# Patient Record
Sex: Female | Born: 1970 | Race: White | Hispanic: No | Marital: Married | Smoking: Former smoker
Health system: Southern US, Community
[De-identification: ages and names within clinical notes are randomized; demographics above are authoritative.]

## PROBLEM LIST (undated history)

## (undated) DIAGNOSIS — F3281 Premenstrual dysphoric disorder: Secondary | ICD-10-CM

## (undated) DIAGNOSIS — Z87442 Personal history of urinary calculi: Secondary | ICD-10-CM

## (undated) DIAGNOSIS — Z1239 Encounter for other screening for malignant neoplasm of breast: Principal | ICD-10-CM

## (undated) HISTORY — PX: TONSILLECTOMY: SUR1361

## (undated) HISTORY — PX: ADENOIDECTOMY: SUR15

## (undated) HISTORY — DX: Encounter for other screening for malignant neoplasm of breast: Z12.39

## (undated) HISTORY — PX: GASTRIC BYPASS: SHX52

## (undated) HISTORY — DX: Premenstrual dysphoric disorder: F32.81

---

## 2000-09-09 ENCOUNTER — Encounter: Payer: Self-pay | Admitting: Physical Medicine and Rehabilitation

## 2000-09-09 ENCOUNTER — Inpatient Hospital Stay (HOSPITAL_COMMUNITY)
Admission: AD | Admit: 2000-09-09 | Discharge: 2000-09-21 | Payer: Self-pay | Admitting: Physical Medicine and Rehabilitation

## 2000-09-15 ENCOUNTER — Encounter: Payer: Self-pay | Admitting: Physical Medicine and Rehabilitation

## 2000-10-25 ENCOUNTER — Encounter
Admission: RE | Admit: 2000-10-25 | Discharge: 2001-01-23 | Payer: Self-pay | Admitting: Physical Medicine and Rehabilitation

## 2001-02-17 ENCOUNTER — Encounter
Admission: RE | Admit: 2001-02-17 | Discharge: 2001-05-18 | Payer: Self-pay | Admitting: Physical Medicine and Rehabilitation

## 2001-06-21 ENCOUNTER — Encounter
Admission: RE | Admit: 2001-06-21 | Discharge: 2001-09-19 | Payer: Self-pay | Admitting: Physical Medicine and Rehabilitation

## 2005-01-22 ENCOUNTER — Ambulatory Visit: Payer: Self-pay | Admitting: Pediatrics

## 2008-07-05 HISTORY — PX: CHOLECYSTECTOMY: SHX55

## 2010-07-05 HISTORY — PX: ORTHOPEDIC SURGERY: SHX850

## 2010-12-23 ENCOUNTER — Other Ambulatory Visit: Payer: Self-pay | Admitting: Obstetrics and Gynecology

## 2010-12-23 DIAGNOSIS — N6320 Unspecified lump in the left breast, unspecified quadrant: Secondary | ICD-10-CM

## 2010-12-28 ENCOUNTER — Ambulatory Visit
Admission: RE | Admit: 2010-12-28 | Discharge: 2010-12-28 | Disposition: A | Discharge status: Home / Self Care | Payer: PRIVATE HEALTH INSURANCE | Source: Ambulatory Visit | Attending: Obstetrics and Gynecology | Admitting: Obstetrics and Gynecology

## 2010-12-28 DIAGNOSIS — N6320 Unspecified lump in the left breast, unspecified quadrant: Secondary | ICD-10-CM

## 2012-01-25 ENCOUNTER — Telehealth: Payer: Self-pay | Admitting: *Deleted

## 2012-01-25 NOTE — Telephone Encounter (Signed)
error 

## 2012-02-29 ENCOUNTER — Telehealth: Payer: Self-pay | Admitting: *Deleted

## 2012-02-29 NOTE — Telephone Encounter (Signed)
Left message for pt to return my call to schedule a genetic appt. 

## 2012-03-01 ENCOUNTER — Telehealth: Payer: Self-pay | Admitting: *Deleted

## 2012-03-01 NOTE — Telephone Encounter (Signed)
Confirmed 04/20/12 genetic appt w/ pt.  Called Clydie Braun at referring to make her aware.  Took paperwork to Clydie Braun.

## 2012-04-20 ENCOUNTER — Other Ambulatory Visit: Payer: PRIVATE HEALTH INSURANCE | Admitting: Lab

## 2012-04-20 ENCOUNTER — Ambulatory Visit (HOSPITAL_BASED_OUTPATIENT_CLINIC_OR_DEPARTMENT_OTHER): Payer: PRIVATE HEALTH INSURANCE | Admitting: Genetic Counselor

## 2012-04-20 ENCOUNTER — Encounter: Payer: Self-pay | Admitting: Genetic Counselor

## 2012-04-20 DIAGNOSIS — IMO0002 Reserved for concepts with insufficient information to code with codable children: Secondary | ICD-10-CM

## 2012-04-20 DIAGNOSIS — Z803 Family history of malignant neoplasm of breast: Secondary | ICD-10-CM

## 2012-04-20 NOTE — Progress Notes (Signed)
Dr.  Harold Hedge requested a consultation for genetic counseling and risk assessment for Kathleen Pratt, a 41 y.o. female, for discussion of her family history of breast, prostate, testicular and lung cancer. She presents to clinic today with her husband to discuss the possibility of a genetic predisposition to cancer, and to further clarify her risks, as well as her family members' risks for cancer.   HISTORY OF PRESENT ILLNESS: [name] is a 41 y.o. female with no personal history of cancer.    History reviewed. No pertinent past medical history.  History reviewed. No pertinent past surgical history.  History  Substance Use Topics  . Smoking status: Former Smoker -- 1.0 packs/day for 15 years    Types: Cigarettes    Quit date: 04/20/2004  . Smokeless tobacco: Not on file  . Alcohol Use: Yes     socially    REPRODUCTIVE HISTORY AND PERSONAL RISK ASSESSMENT FACTORS: Menarche was at age 72.   Premenopausal Uterus Intact: Yes Ovaries Intact: Yes G6P3A3 , first live birth at age 50  She has not previously undergone treatment for infertility.   OCP use for 14-15 years   She has not used HRT in the past.    FAMILY HISTORY:  We obtained a detailed, 4-generation family history.  Significant diagnoses are listed below: Family History  Problem Relation Age of Onset  . Breast cancer Mother 22  . Prostate cancer Maternal Uncle 66  . Breast cancer Paternal Aunt     diagnosed in her 21s; father's paternal half sister  . Lung cancer Maternal Grandmother     non smoker; diagnosed in her 23s  . Breast cancer Paternal Grandmother     diagnosed in her late 43s-early 76s  . Testicular cancer Cousin 50    maternal cousin  The patient's mother was diagnosed with breast cancer at age 56 and died at 21.  Her mother had one brother who has prostate cancer diagnosed at age 67.  This uncle has one son, who was diagnosed with testicular cancer at age 72.  The patient's grandmother was a  non-smoker but was diagnosed with lung cancer in her 40s.  The patient's paternal grandmother was diagnosed with breast cancer and died in her 25s.  Her father's paternal half sister was diagnosed with breast cancer in her 10s.  There is no other reported history of cancer.  Patient's maternal ancestors are of unknown descent, and paternal ancestors are of unknown descent. There is no reported Ashkenazi Jewish ancestry. There is no known consanguinity.  GENETIC COUNSELING RISK ASSESSMENT, DISCUSSION, AND SUGGESTED FOLLOW UP: We reviewed the natural history and genetic etiology of sporadic, familial and hereditary cancer syndromes.  About 5-10% of breast cancer is hereditary.  Of this, about 85% is the result of a BRCA1 or BRCA2 mutation.  We reviewed the red flags of hereditary cancer syndromes and the dominant inheritance patterns.  If the BRCA testing is negative, we discussed that we could be testing for the wrong gene.  We discussed gene panels, and that several cancer genes that are associated with different cancers can be tested at the same time.  About 4% of young women with breast cancer who are negative for BRCA mutations have a TP53 mutation.  Additionally there are several other genes that can be associated with young breast cancer.  Because of the young onset of cancer in the patients mother, as well as the different cancers in the family, we will consider the BRCAPlus test.  The patient's family history is suggestive of the following possible diagnosis: hereditary cancer syndrome  We discussed that identification of a hereditary cancer syndrome may help her care providers tailor the patients medical management. If a mutation indicating a hereditary cancer syndrome is detected in this case, the [employer] recommendations would include increased cancer surveillance and possible prophylactic surgery. If a mutation is detected, the patient will be referred back to the  referring provider and to any additional appropriate care providers to discuss the relevant options.   If a mutation is not found in the patient, cancer surveillance options would be discussed for the patient according to the appropriate standard National Comprehensive Cancer Network and American Cancer Society guidelines, with consideration of their personal and family history risk factors. In this case, the patient will be referred back to their care providers for discussions of management.   In order to estimate her chance of having a BRCA mutation, we used statistical models (Penn II and tyrer cusik) and laboratory data that take into account her personal medical history, family history and ancestry.  Because each model is different, there can be a lot of variability in the risks they give.  Therefore, these numbers must be considered a rough range and not a precise risk of having a BRCA mutation.  These models estimate that she has approximately a 5-10% chance of having a mutation. Based on this assessment of her family and personal history, genetic testing is recommended.  Based on the patient's personal and family history, statistical models (Tyrer cusik)  and literature data were used to estimate her risk of developing Breast cancer. These estimate her lifetime risk of developing breast cancer to be approximately 49%-53%. This estimation does not take into account any genetic testing results.   After considering the risks, benefits, and limitations, the patient provided informed consent for  the following  testing: BRCAPlus through [employer]   Per the patient's request, we will contact her by telephone to discuss these results. A follow up genetic counseling visit will be scheduled if indicated.  The patient was seen for a total of 60 minutes, greater than 50% of which was spent face-to-face counseling.  This plan is being carried out per Dr. Huel Coventry recommendations.  This note will also  be sent to the referring provider via the electronic medical record. The patient will be supplied with a summary of this genetic counseling discussion as well as educational information on the discussed hereditary cancer syndromes following the conclusion of their visit.   Patient was discussed with Dr. Drue Second.   _______________________________________________________________________ For Office Staff:  Number of people involved in session: 3 Was an Intern/ student involved with case: no

## 2012-05-04 ENCOUNTER — Telehealth: Payer: Self-pay | Admitting: *Deleted

## 2012-05-04 ENCOUNTER — Encounter: Payer: Self-pay | Admitting: Oncology

## 2012-05-04 ENCOUNTER — Ambulatory Visit (HOSPITAL_BASED_OUTPATIENT_CLINIC_OR_DEPARTMENT_OTHER): Payer: PRIVATE HEALTH INSURANCE | Admitting: Oncology

## 2012-05-04 VITALS — BP 131/79 | HR 98 | Temp 98.5°F | Resp 20 | Ht 64.5 in | Wt 177.6 lb

## 2012-05-04 DIAGNOSIS — Z1239 Encounter for other screening for malignant neoplasm of breast: Secondary | ICD-10-CM

## 2012-05-04 DIAGNOSIS — Z1231 Encounter for screening mammogram for malignant neoplasm of breast: Secondary | ICD-10-CM

## 2012-05-04 DIAGNOSIS — Z803 Family history of malignant neoplasm of breast: Secondary | ICD-10-CM

## 2012-05-04 DIAGNOSIS — Z809 Family history of malignant neoplasm, unspecified: Secondary | ICD-10-CM

## 2012-05-04 HISTORY — DX: Encounter for other screening for malignant neoplasm of breast: Z12.39

## 2012-05-04 NOTE — Telephone Encounter (Signed)
Gave patient appointment for 07-27-2012 starting at 8:30am  Gave patient appointment for the mri of the breast arrival time 12:45pm

## 2012-05-04 NOTE — Patient Instructions (Addendum)
We discussed your breast cancer  We will order a MRI for surveillance  I will see you back in 2 months for follow up

## 2012-05-04 NOTE — Progress Notes (Signed)
[facility]  High Risk Clinic New Patient Evaluation  Name: Kathleen Pratt            Date: 05/04/2012 MRN: [id number]                DOB: July 12, 1970  CC: Dr. Feliciana Rossetti Dr. Henderson Cloud  REFERRING PHYSICIAN: Dr. Henderson Cloud   REASON FOR VISIT: 41 year old female with family history of cancers who was seen in the genetic clinic for discussion of risk of hereditary cancers.  HISTORY OF PRESENT ILLNESS: Kathleen Pratt is a 41 y.o. female here for hereditary cancer risk assessment. She presents to the clinic with concerns regarding a possibility of hereditary predisposition to cancer and the implication of this for her own healthcare and for her family. Patient has not had prior history of biopsies. She was recently seen by Maylon Cos in the genetics clinic for risk assessment and genetic testing. She did have blood drawn for BRCA plus through [facility]. Results of which are pending at this time. During the counseling session patient did have a tyrer cusick risk assessment performed and her lifetime risk of developing breast cancer was anywhere between 49-53%. Because of this she is referred to the high-risk clinic for discussion of reducing her risk of developing cancers specifically breast cancer. She otherwise is without any complaints.  PAST MEDICAL HISTORY:  has a past medical history of Breast cancer screening, high risk patient (05/04/12).  PAST SURGICAL HISTORY: No past surgical history on file.    CURRENT MEDICATIONS: Ms. Buza does not currently have medications on file.  ALLERGIES: Erythromycin  SOCIAL HISTORY:  reports that she quit smoking about 8 years ago. Her smoking use included Cigarettes. She has a 15 pack-year smoking history. She does not have any smokeless tobacco history on file. She reports that she drinks alcohol.  HEALTH HABITS: Vitamins: yes Supplements: no Alternative Therapies: no Adverse environmental exposure: no Servings of fruit  and vegetables/day: 3-4/day Servings of meat/day:2 servings/day Exercises regularly:        30 min/day    7 days Smoker/nonsmoker: former smoker quit at 34 Alcohol: 1/month Number of alcoholic beverages/week:0  REPRODUCTIVE HISTORY:  Menarche age: 68 Gravida:   6    Para: 3 First Live Birth: 28 Number of live births: 3 Breast fed: Y/N  # months yes all children 10/17/11 Took fertility meds:     none                Type:  Menses: regular menses Oral Contraceptives:      15   # of years Menopause: natural/surgical  Age N/A HRT Y/N Currently Y/N TType N/A:                                   # years Sexually transmitted disease:  N/A  FAMILY HISTORY:  family history includes Breast cancer in her paternal aunt and paternal grandmother; Breast cancer (age of onset:28) in her mother; Lung cancer in her maternal grandmother; Prostate cancer (age of onset:66) in her maternal uncle; and Testicular cancer (age of onset:25) in her cousin.  HEALTH MAINTENANCE: Last mammogram: 2 months Last clinical breast exam: 2 months Performs self breast exam: yes Last Pap Smear: 2 months Colonoscopy: not yet Last skin exam: no  REVIEW OF SYSTEMS:  General: Negative for fever, chills, night sweats,  loss of appetite or weight loss. HEENT: Negative for headaches, sore  throat, difficulty swallowing, blurred vision or problem with hearing or  sinus congestion. Respiratory: Negative for shortness of breath, cough  or dyspnea on exertion. Cardiovascular: Negative for chest pain,  palpitations or pedal edema. GI: Negative for nausea, vomiting,  diarrhea, constipation, change in bowel habits or blood in the stool.  No jaundice. GU: Negative for painful or frequent urination, change in  color of urine, or decreased urinary stream. Integumentary: Negative  for skin rashes or other suspicious skin lesions. Hematologic: Negative  for easy bruisability or bleeding. Musculoskeletal: Negative for  complaints of  pain, arthralgias, arthritis or myalgias.  Neurological/psychiatric: Negative for numbness, focal weakness,  balance problems or coordination difficulties. No depression or mood swings.  Breast: No self detected abnormalities in the breast. No nipple discharge, masses or redness of the skin.   PHYSICAL EXAM: BP 131/79  Pulse 98  Temp 98.5 F (36.9 C) (Oral)  Resp 20  Ht 5' 4.5" (1.638 m)  Wt 177 lb 9.6 oz (80.559 kg)  BMI 30.01 kg/m2 GENERAL: Well developed, well nourished, in no acute distress.  EENT: No ocular or oral lesions. No stomatitis.  RESPIRATORY: Lungs are clear to auscultation bilaterally with normal respiratory movement and no accessory muscle use. CARDIAC: No murmur, rub or tachycardia. No upper or lower extremity edema.  GI: Abdomen is soft, no palpable hepatosplenomegaly. No fluid wave. No tenderness. Musculoskeletal: No kyphosis, no tenderness over the spine, ribs or hips. Lymph: No cervical, infraclavicular, axillary or inguinal adenopathy. Neuro: No focal neurological deficits. Psych: Alert and oriented X 3, appropriate mood and affect.  BREAST EXAM: In the supine position, with the right arm over the head, right nipple is everted. No periareolar edema or nipple discharge. No mass in any quadrant or subareolar region. No redness of the skin. No right axillary adenopathy. With the left arm over the head, left nipple is everted. No periareolar edema or nipple discharge. No mass in any quadrant or subareolar region. No redness of the skin. No left axillary adenopathy.    ASSESSMENT: 41 year old female with family history significant for rest cancers on the maternal side with mother having had breast cancer at 70 paternal uncle with prostate cancer at 24 patient had a paternal aunt who was diagnosed with cancer in her 44s. There is also history of breast cancer in a paternal grandmother in her late 15s. A cousin had testicular cancer. Because of this patient's risk of  developing cancer is high but it is uncertain whether she has a genetic predisposition. She was seen by our genetic counselor and she indeed did have a BRCA plus testing performed. Unfortunately the results of this is not available to me. Today patient and I in general discussed hereditary breast cancers including discussion of BRCA1 and BRCA2 gene mutations. We discussed risk reductions surgically versus medically. We also discussed surveillance which essentially is early detection. I do think that if patient is a BRCA2 carrier then she may be a good candidate for either surgical risk reduction for breast as well as ovarian cancer. Especially if she has completed her family and she is ready to embark on a surgical procedure. Her risk reduction for breast cancer surgically would be greater than 90% and for ovarian cancer greater than 95%. However if patient does not want surgical risk reduction then certainly she would be a candidate for surveillance as well as medical risk reduction. 4 breast cancer medical risk reduction would include use of tamoxifen. For ovarian the possibility of oral contraceptive pills.  Early surveillance would include breast mammograms as well as MRIs self breast examinations and clinical breast exams. For ovarian surveillance we certainly could utilize C1 25 testing as well as vaginal ultrasounds. I did speak to the patient regarding surveillance for ovarian cancer that there really is no proven surveillance but these are 2 modalities that we do use. If patient however is negative for BRCA1 or BRCA2 mutation then she certainly still would be a candidate for chemoprevention with tamoxifen. We also discussed surveillance including mammograms and MRIs on a yearly basis along with clinical and self breast examinations. We also discussed lifestyle modifications including exercise having a good diet plan stress reduction ALT to help with reducing risk. Also reducing her alkaline alcohol  consumption. And not smoking.  PLAN:  #1 I will await the results of patient's genetic testing.  #2 certainly I can get her set her up for MRI of the breasts.  #3 I will plan on seeing her back in a few months time for followup.  The length of time of the face-to-face encounter was 60 minutes. More than 50% of time was spent counseling and coordination of care.  Drue Second, MD Medical/Oncology [facility] [phone] (beeper) [phone] (Office)

## 2012-05-10 ENCOUNTER — Encounter: Payer: Self-pay | Admitting: Genetic Counselor

## 2012-05-10 ENCOUNTER — Telehealth: Payer: Self-pay | Admitting: Genetic Counselor

## 2012-05-10 NOTE — Telephone Encounter (Signed)
Left good news message and asked for her to call back. 

## 2012-05-11 ENCOUNTER — Inpatient Hospital Stay (HOSPITAL_COMMUNITY): Admission: RE | Admit: 2012-05-11 | Payer: PRIVATE HEALTH INSURANCE | Source: Ambulatory Visit

## 2012-05-13 ENCOUNTER — Ambulatory Visit (HOSPITAL_COMMUNITY): Admission: RE | Admit: 2012-05-13 | Payer: PRIVATE HEALTH INSURANCE | Source: Ambulatory Visit

## 2012-05-17 ENCOUNTER — Ambulatory Visit (HOSPITAL_COMMUNITY): Admission: RE | Admit: 2012-05-17 | Payer: PRIVATE HEALTH INSURANCE | Source: Ambulatory Visit

## 2012-05-17 ENCOUNTER — Telehealth: Payer: Self-pay | Admitting: *Deleted

## 2012-05-17 NOTE — Telephone Encounter (Signed)
Call back from pt request Order be faxed to [phone]. Pt will take to scheduling for an appt to be made.

## 2012-05-17 NOTE — Telephone Encounter (Signed)
Pt called request MRI of Breast be done at [facility] as she works there and it's closer and insurance. Called MRI at [facility] cancelled pt's MRI appt for 05/17/12. Returned pt's called lmovm for pt to call back with a fax # for MRI order to be sent. MD aware.

## 2012-06-06 ENCOUNTER — Telehealth: Payer: Self-pay | Admitting: *Deleted

## 2012-06-06 ENCOUNTER — Other Ambulatory Visit: Payer: Self-pay | Admitting: Oncology

## 2012-06-06 DIAGNOSIS — R928 Other abnormal and inconclusive findings on diagnostic imaging of breast: Secondary | ICD-10-CM

## 2012-06-06 NOTE — Telephone Encounter (Signed)
Pt called lmovm with concern regarding a recommendation for a biopsy. Returned pt's call per MD, she agrees pt should have an Biopsy to rule out anything further.

## 2012-06-12 ENCOUNTER — Telehealth: Payer: Self-pay | Admitting: Oncology

## 2012-06-12 NOTE — Telephone Encounter (Signed)
C/D 06/12/12 for appt.07/27/12

## 2012-06-14 ENCOUNTER — Ambulatory Visit: Admission: RE | Admit: 2012-06-14 | Payer: PRIVATE HEALTH INSURANCE | Source: Ambulatory Visit

## 2012-06-14 ENCOUNTER — Ambulatory Visit
Admission: RE | Admit: 2012-06-14 | Discharge: 2012-06-14 | Disposition: A | Discharge status: Home / Self Care | Payer: PRIVATE HEALTH INSURANCE | Source: Ambulatory Visit | Attending: Oncology | Admitting: Oncology

## 2012-06-14 DIAGNOSIS — R928 Other abnormal and inconclusive findings on diagnostic imaging of breast: Secondary | ICD-10-CM

## 2012-06-14 MED ORDER — GADOBENATE DIMEGLUMINE 529 MG/ML IV SOLN
15.0000 mL | Freq: Once | INTRAVENOUS | Status: AC | PRN
  Administered 2012-06-14: 15 mL via INTRAVENOUS

## 2012-06-15 ENCOUNTER — Telehealth: Payer: Self-pay | Admitting: Medical Oncology

## 2012-06-15 NOTE — Telephone Encounter (Signed)
Please let patient know that we can discuss this at her next upcoming visit.

## 2012-06-15 NOTE — Telephone Encounter (Signed)
Patient called with concern regarding results of recent MRI at [facility] (12/11), states "they didn't find the same results that [facility] did on 11/26, I'm worried because on one MRI you can see and measure it but you can't see it on another. Is it because I was at different days in my monthly cycle? I have a lot of fibroids in my breasts. I don't wish to sit for 6 months waiting, I want a resolution. I'm concerned that the next MRI is too far ahead when [place] says I have something." Patient would like to speak with MD regarding her concerns. Will review with MD.

## 2012-06-16 ENCOUNTER — Telehealth: Payer: Self-pay | Admitting: Medical Oncology

## 2012-06-16 NOTE — Telephone Encounter (Signed)
Error

## 2012-06-16 NOTE — Telephone Encounter (Signed)
Patient called wishing to discuss results of her MRI, no results received as of yet from MRI done (12/11). I had also asked pt to have [facility] send results to our office for Dr. Welton Flakes to review. Patient stated she will call them and ask them to send the MRI 11/26 results to Dr. Welton Flakes. Patient would also like to see Dr. Welton Flakes before her scheduled appt 07/27/12. Returned call to pt and LVMOM to notify pt that Dr. Welton Flakes has no available earlier appts at this time and should she have any further questions to please call us.

## 2012-06-21 ENCOUNTER — Encounter: Payer: Self-pay | Admitting: Oncology

## 2012-07-26 ENCOUNTER — Other Ambulatory Visit: Payer: Self-pay | Admitting: *Deleted

## 2012-07-26 DIAGNOSIS — Z1231 Encounter for screening mammogram for malignant neoplasm of breast: Secondary | ICD-10-CM

## 2012-07-27 ENCOUNTER — Telehealth: Payer: Self-pay | Admitting: Oncology

## 2012-07-27 ENCOUNTER — Ambulatory Visit (HOSPITAL_BASED_OUTPATIENT_CLINIC_OR_DEPARTMENT_OTHER): Payer: PRIVATE HEALTH INSURANCE | Admitting: Oncology

## 2012-07-27 ENCOUNTER — Encounter: Payer: Self-pay | Admitting: Oncology

## 2012-07-27 ENCOUNTER — Other Ambulatory Visit: Payer: Self-pay | Admitting: Medical Oncology

## 2012-07-27 ENCOUNTER — Other Ambulatory Visit (HOSPITAL_BASED_OUTPATIENT_CLINIC_OR_DEPARTMENT_OTHER): Payer: PRIVATE HEALTH INSURANCE | Admitting: Lab

## 2012-07-27 VITALS — BP 125/82 | HR 80 | Temp 98.4°F | Resp 20 | Ht 64.5 in | Wt 166.5 lb

## 2012-07-27 DIAGNOSIS — Z1231 Encounter for screening mammogram for malignant neoplasm of breast: Secondary | ICD-10-CM

## 2012-07-27 DIAGNOSIS — Z1239 Encounter for other screening for malignant neoplasm of breast: Secondary | ICD-10-CM

## 2012-07-27 LAB — COMPREHENSIVE METABOLIC PANEL (CC13)
AST: 25 U/L (ref 5–34)
Albumin: 3.4 g/dL — ABNORMAL LOW (ref 3.5–5.0)
Alkaline Phosphatase: 94 U/L (ref 40–150)
Chloride: 107 mEq/L (ref 98–107)
Glucose: 76 mg/dl (ref 70–99)
Potassium: 3.7 mEq/L (ref 3.5–5.1)
Sodium: 140 mEq/L (ref 136–145)
Total Protein: 6.3 g/dL — ABNORMAL LOW (ref 6.4–8.3)

## 2012-07-27 LAB — CBC WITH DIFFERENTIAL/PLATELET
HCT: 39.1 % (ref 34.8–46.6)
MCH: 32.7 pg (ref 25.1–34.0)
MCHC: 34.8 g/dL (ref 31.5–36.0)
NEUT%: 51.4 % (ref 38.4–76.8)
RDW: 13 % (ref 11.2–14.5)
lymph#: 1.5 10*3/uL (ref 0.9–3.3)

## 2012-07-27 NOTE — Patient Instructions (Addendum)
Proceed with MRI of bilateral breasts  I will see you back after the MRI

## 2012-07-27 NOTE — Telephone Encounter (Signed)
gv pt appt schedule for May. Pt aware I will contact her re appt for mri-breast. Per order breast mri in May - no mammo order given or on schedule and pt is not due. Pt will need mammo b4 proceeding w/mri. lmonvm for [place] Ponder @ the [facility] to confirm this so I can get mammo order if needed.

## 2012-07-28 ENCOUNTER — Telehealth: Payer: Self-pay | Admitting: Oncology

## 2012-07-28 NOTE — Telephone Encounter (Signed)
S/w [place] Ponder @ [facility] yesterday (1/23) and per Lynden Ang pt does not need a mammo b4 having breast mri in May due to how she is being monitored. Per Lynden Ang she will call pt w/appt for breast mri in May. S/w pt today she is aware and was given May schedule prior to leaving yesterday.

## 2012-08-02 NOTE — Progress Notes (Signed)
[facility]  High Risk Clinic Follow-up Visit  Name: Kathleen Pratt            Date: 08/02/2012 MRN: [id number]                DOB: 06/29/1971  CC:  Dr. Henderson Cloud Dr. Feliciana Rossetti  REFERRING PHYSICIAN: Dr. Feliciana Rossetti  REASON FOR VISIT: Patient is here for followup after having had her MRI of the bilateral breast  HISTORY OF PRESENT ILLNESS: Neka Bise is a 42 y.o. female here for follow-up of hereditary cancer risk assessment. Patient recently had a MRI performed at [facility] that showed an area of clumped enhancement in the left breast at the 6:00 position. Because of this she was referred to the breast Center for MR guided biopsy of this abnormal enhancement on 06/14/2012. A MRI was performed again and patient had no evidence of an abnormal enhancement in that position. Therefore a six-month followup MRI was recommended. Clinically patient is without any complaints.  CURRENT MEDICATIONS: Ms. Hainer does not currently have medications on file.  ALLERGIES: Erythromycin  SOCIAL HISTORY:  reports that she quit smoking about 8 years ago. Her smoking use included Cigarettes. She has a 15 pack-year smoking history. She does not have any smokeless tobacco history on file. She reports that she drinks alcohol.  CHANGE IN FAMILY HISTORY:    HEALTH MAINTENANCE: Last mammogram: Last clinical breast exam: Performs self breast exam: Last Pap Smear: Colonoscopy:  Last skin exam:  REVIEW OF SYSTEMS:  General: Negative for fever, chills, night sweats,  loss of appetite or weight loss. HEENT: Negative for headaches, sore  throat, difficulty swallowing, blurred vision or problem with hearing or  sinus congestion. Respiratory: Negative for shortness of breath, cough  or dyspnea on exertion. Cardiovascular: Negative for chest pain,  palpitations or pedal edema. GI: Negative for nausea, vomiting,  diarrhea, constipation, change in bowel habits or blood in the  stool.  No jaundice. GU: Negative for painful or frequent urination, change in  color of urine, or decreased urinary stream. Integumentary: Negative  for skin rashes or other suspicious skin lesions. Hematologic: Negative  for easy bruisability or bleeding. Musculoskeletal: Negative for  complaints of pain, arthralgias, arthritis or myalgias.  Neurological/psychiatric: Negative for numbness, focal weakness,  balance problems or coordination difficulties. No depression or mood swings.  Breast: No self detected abnormalities in the breast. No nipple discharge, masses or redness of the skin.   PHYSICAL EXAM: BP 125/82  Pulse 80  Temp 98.4 F (36.9 C) (Oral)  Resp 20  Ht 5' 4.5" (1.638 m)  Wt 166 lb 8 oz (75.524 kg)  BMI 28.14 kg/m2 GENERAL: Well developed, well nourished, in no acute distress.  EENT: No ocular or oral lesions. No stomatitis.  RESPIRATORY: Lungs are clear to auscultation bilaterally with normal respiratory movement and no accessory muscle use. CARDIAC: No murmur, rub or tachycardia. No upper or lower extremity edema.  GI: Abdomen is soft, no palpable hepatosplenomegaly. No fluid wave. No tenderness. Musculoskeletal: No kyphosis, no tenderness over the spine, ribs or hips. Lymph: No cervical, infraclavicular, axillary or inguinal adenopathy. Neuro: No focal neurological deficits. Psych: Alert and oriented X 3, appropriate mood and affect.  BREAST EXAM: In the supine position, with the right arm over the head, right nipple is everted. No periareolar edema or nipple discharge. No mass in any quadrant or subareolar region. No redness of the skin. No right axillary adenopathy. With the left arm over the  head, left nipple is everted. No periareolar edema or nipple discharge. No mass in any quadrant or subareolar region. No redness of the skin. No left axillary adenopathy.    ASSESSMENT: 42 year old female with a strong family history for breast cancer. Patient has a lifetime  risk 49-53% this done risk assessment tools. Because of this she has been getting MRI of the breasts performed for some performed was in November 2013. That did show a area of clumped enhancement in the left breast at the 6:00 position. This MRI was performed in [facility]. However subsequent MR guided biopsy was attempted but once be breast was visualized by MR there is no area of enhancement and therefore a biopsy was not done. Patient and I discussed this. Recommendation is at this time 2 followup MRI in 6 months time.  PLAN:  #1 patient will followup with an MRI of the breasts in 6 months time.  #2 patient had considerable amount of anxiety I spent quite a bit of time with her discussing the different reasons that the MRI may have may not have shown the area. I tried to reassure her. Patient was very tearful. But after extensive discussion she was better composed. And she will return in 6 months time for followup with me.  The length of time of the face-to-face encounter was 40    minutes. More than 50% of time was spent counseling and coordination of care.  Drue Second, MD Medical/Oncology [facility] [phone] (beeper) [phone] (Office)  08/02/2012, 4:03 PM

## 2012-11-08 ENCOUNTER — Telehealth: Payer: Self-pay | Admitting: *Deleted

## 2012-11-08 NOTE — Telephone Encounter (Signed)
Pt called to cancel her appt and r/s because her MRI is not until June. gv pt appt d/t for 02/16/13. Also made her aware that i will mail her a cal...td

## 2012-11-13 ENCOUNTER — Other Ambulatory Visit: Payer: PRIVATE HEALTH INSURANCE | Admitting: Lab

## 2012-11-13 ENCOUNTER — Ambulatory Visit: Payer: PRIVATE HEALTH INSURANCE | Admitting: Oncology

## 2012-12-09 ENCOUNTER — Ambulatory Visit
Admission: RE | Admit: 2012-12-09 | Discharge: 2012-12-09 | Disposition: A | Discharge status: Home / Self Care | Payer: PRIVATE HEALTH INSURANCE | Source: Ambulatory Visit | Attending: Oncology | Admitting: Oncology

## 2012-12-09 DIAGNOSIS — Z1239 Encounter for other screening for malignant neoplasm of breast: Secondary | ICD-10-CM

## 2012-12-09 MED ORDER — GADOBENATE DIMEGLUMINE 529 MG/ML IV SOLN
14.0000 mL | Freq: Once | INTRAVENOUS | Status: AC | PRN
  Administered 2012-12-09: 14 mL via INTRAVENOUS

## 2012-12-12 ENCOUNTER — Other Ambulatory Visit: Payer: Self-pay | Admitting: Emergency Medicine

## 2012-12-13 NOTE — Progress Notes (Signed)
Quick Note:  Please call patient: MRI breasts look good ______

## 2013-02-15 ENCOUNTER — Other Ambulatory Visit: Payer: Self-pay | Admitting: *Deleted

## 2013-02-15 DIAGNOSIS — Z1239 Encounter for other screening for malignant neoplasm of breast: Secondary | ICD-10-CM

## 2013-02-16 ENCOUNTER — Encounter: Payer: Self-pay | Admitting: Oncology

## 2013-02-16 ENCOUNTER — Other Ambulatory Visit (HOSPITAL_BASED_OUTPATIENT_CLINIC_OR_DEPARTMENT_OTHER): Payer: PRIVATE HEALTH INSURANCE | Admitting: Lab

## 2013-02-16 ENCOUNTER — Ambulatory Visit (HOSPITAL_BASED_OUTPATIENT_CLINIC_OR_DEPARTMENT_OTHER): Payer: PRIVATE HEALTH INSURANCE | Admitting: Oncology

## 2013-02-16 ENCOUNTER — Telehealth: Payer: Self-pay | Admitting: *Deleted

## 2013-02-16 VITALS — BP 110/70 | HR 91 | Temp 98.5°F | Resp 20 | Ht 64.5 in | Wt 161.2 lb

## 2013-02-16 DIAGNOSIS — Z803 Family history of malignant neoplasm of breast: Secondary | ICD-10-CM

## 2013-02-16 DIAGNOSIS — Z1231 Encounter for screening mammogram for malignant neoplasm of breast: Secondary | ICD-10-CM

## 2013-02-16 DIAGNOSIS — Z1239 Encounter for other screening for malignant neoplasm of breast: Secondary | ICD-10-CM

## 2013-02-16 LAB — CBC WITH DIFFERENTIAL/PLATELET
EOS%: 0 % (ref 0.0–7.0)
Eosinophils Absolute: 0 10*3/uL (ref 0.0–0.5)
HCT: 39.5 % (ref 34.8–46.6)
HGB: 12.8 g/dL (ref 11.6–15.9)
MCH: 30.5 pg (ref 25.1–34.0)
MCHC: 32.4 g/dL (ref 31.5–36.0)
MCV: 94 fL (ref 79.5–101.0)
NEUT#: 1.9 10*3/uL (ref 1.5–6.5)
Platelets: 170 10*3/uL (ref 145–400)
RDW: 12.6 % (ref 11.2–14.5)

## 2013-02-16 LAB — COMPREHENSIVE METABOLIC PANEL (CC13)
AST: 18 U/L (ref 5–34)
Calcium: 8.5 mg/dL (ref 8.4–10.4)
Chloride: 112 mEq/L — ABNORMAL HIGH (ref 98–109)
Glucose: 58 mg/dl — ABNORMAL LOW (ref 70–140)
Potassium: 3.9 mEq/L (ref 3.5–5.1)
Sodium: 143 mEq/L (ref 136–145)

## 2013-02-16 MED ORDER — TAMOXIFEN CITRATE 20 MG PO TABS: 20.0000 mg | ORAL_TABLET | Freq: Every day | ORAL | Status: DC

## 2013-02-16 NOTE — Telephone Encounter (Signed)
appts made and printed...td 

## 2013-02-16 NOTE — Patient Instructions (Addendum)
We discussed your MRI results  We discussed starting you on tamoxifen 20 mg daily chemoprevention  I will see you back in 4 months   Tamoxifen oral tablet What is this medicine? TAMOXIFEN (ta MOX i fen) blocks the effects of estrogen. It is commonly used to treat breast cancer. It is also used to decrease the chance of breast cancer coming back in women who have received treatment for the disease. It may also help prevent breast cancer in women who have a high risk of developing breast cancer. This medicine may be used for other purposes; ask your health care provider or pharmacist if you have questions. What should I tell my health care provider before I take this medicine? They need to know if you have any of these conditions: -blood clots -blood disease -cataracts or impaired eyesight -endometriosis -high calcium levels -high cholesterol -irregular menstrual cycles -liver disease -stroke -uterine fibroids -an unusual or allergic reaction to tamoxifen, other medicines, foods, dyes, or preservatives -pregnant or trying to get pregnant -breast-feeding How should I use this medicine? Take this medicine by mouth with a glass of water. Follow the directions on the prescription label. You can take it with or without food. Take your medicine at regular intervals. Do not take your medicine more often than directed. Do not stop taking except on your doctor's advice. A special MedGuide will be given to you by the pharmacist with each prescription and refill. Be sure to read this information carefully each time. Talk to your pediatrician regarding the use of this medicine in children. While this drug may be prescribed for selected conditions, precautions do apply. Overdosage: If you think you have taken too much of this medicine contact a poison control center or emergency room at once. NOTE: This medicine is only for you. Do not share this medicine with others. What if I miss a dose? If you  miss a dose, take it as soon as you can. If it is almost time for your next dose, take only that dose. Do not take double or extra doses. What may interact with this medicine? -aminoglutethimide -bromocriptine -chemotherapy drugs -female hormones, like estrogens and birth control pills -letrozole -medroxyprogesterone -phenobarbital -rifampin -warfarin This list may not describe all possible interactions. Give your health care provider a list of all the medicines, herbs, non-prescription drugs, or dietary supplements you use. Also tell them if you smoke, drink alcohol, or use illegal drugs. Some items may interact with your medicine. What should I watch for while using this medicine? Visit your doctor or health care professional for regular checks on your progress. You will need regular pelvic exams, breast exams, and mammograms. If you are taking this medicine to reduce your risk of getting breast cancer, you should know that this medicine does not prevent all types of breast cancer. If breast cancer or other problems occur, there is no guarantee that it will be found at an early stage. Do not become pregnant while taking this medicine or for 2 months after stopping this medicine. Stop taking this medicine if you get pregnant or think you are pregnant and contact your doctor. This medicine may harm your unborn baby. Women who can possibly become pregnant should use birth control methods that do not use hormones during tamoxifen treatment and for 2 months after therapy has stopped. Talk with your health care provider for birth control advice. Do not breast feed while taking this medicine. What side effects may I notice from receiving this medicine?  Side effects that you should report to your doctor or health care professional as soon as possible: -changes in vision (blurred vision) -changes in your menstrual cycle -difficulty breathing or shortness of breath -difficulty walking or talking -new  breast lumps -numbness -pelvic pain or pressure -redness, blistering, peeling or loosening of the skin, including inside the mouth -skin rash or itching (hives) -sudden chest pain -swelling of lips, face, or tongue -swelling, pain or tenderness in your calf or leg -unusual bruising or bleeding -vaginal discharge that is bloody, brown, or rust -weakness -yellowing of the whites of the eyes or skin Side effects that usually do not require medical attention (report to your doctor or health care professional if they continue or are bothersome): -fatigue -hair loss, although uncommon and is usually mild -headache -hot flashes -impotence (in men) -nausea, vomiting (mild) -vaginal discharge (white or clear) This list may not describe all possible side effects. Call your doctor for medical advice about side effects. You may report side effects to FDA at 1-800-FDA-1088. Where should I keep my medicine? Keep out of the reach of children. Store at room temperature between 20 and 25 degrees C (68 and 77 degrees F). Protect from light. Keep container tightly closed. Throw away any unused medicine after the expiration date. NOTE: This sheet is a summary. It may not cover all possible information. If you have questions about this medicine, talk to your doctor, pharmacist, or health care provider.  2012, Elsevier/Gold Standard. (03/07/2008 12:01:56 PM)

## 2013-03-05 NOTE — Progress Notes (Signed)
OFFICE PROGRESS NOTE  CC  GRISSO,GREG, MD [address] [place] [address]  DIAGNOSIS: 42 year old female with strong family history of breast cancer here for high-risk clinic.  PRIOR THERAPY: #42 year old female with a strong family history for breast cancer. Patient has a lifetime risk 49-53% this done risk assessment tools. Because of this she has been getting MRI of the breasts performed for some performed was in November 2013. That did show a area of clumped enhancement in the left breast at the 6:00 position. This MRI was performed in [facility]. However subsequent MR guided biopsy was attempted but once be breast was visualized by MR there is no area of enhancement and therefore a biopsy was not done. Patient and I discussed this. Recommendation is at this time 2 followup MRI in 6 months time.  #2 patient had a repeat MRI performed prior to this visit it looks fine.  CURRENT THERAPY:Tamoxifen 20 mg daily  INTERVAL HISTORY: Kathleen Pratt 42 y.o. female returns for followup visit after her MRI. Overall she's feeling better her MRI results were negative. Clinically she has no complaints she is tolerating tamoxifen very nicely without any problems  MEDICAL HISTORY: Past Medical History  Diagnosis Date  . Breast cancer screening, high risk patient 05/04/12    ALLERGIES:  is allergic to erythromycin.  MEDICATIONS:  Current Outpatient Prescriptions  Medication Sig Dispense Refill  . ALPRAZolam (XANAX) 0.5 MG tablet Take 0.5 mg by mouth at bedtime as needed. As needed for sleep      . FLUoxetine (PROZAC) 20 MG capsule Take 20 mg by mouth daily.      . Multiple Vitamin (MULTIVITAMIN) capsule Take 1 capsule by mouth 3 (three) times daily.      Marland Kitchen omeprazole (PRILOSEC) 20 MG capsule Take 20 mg by mouth daily.      . tamoxifen (NOLVADEX) 20 MG tablet Take 1 tablet (20 mg total) by mouth daily.  30 tablet  6   No current facility-administered medications for this visit.     SURGICAL HISTORY: History reviewed. No pertinent past surgical history.  REVIEW OF SYSTEMS:  A comprehensive review of systems was negative.   HEALTH MAINTENANCE:   PHYSICAL EXAMINATION: Blood pressure 110/70, pulse 91, temperature 98.5 F (36.9 C), temperature source Oral, resp. rate 20, height 5' 4.5" (1.638 m), weight 161 lb 3.2 oz (73.12 kg). Body mass index is 27.25 kg/(m^2). ECOG PERFORMANCE STATUS: 0 - Asymptomatic   General appearance: alert, cooperative and appears stated age Resp: clear to auscultation bilaterally Cardio: regular rate and rhythm GI: soft, non-tender; bowel sounds normal; no masses,  no organomegaly Extremities: extremities normal, atraumatic, no cyanosis or edema Neurologic: Grossly normal Breasts: breasts appear normal, no suspicious masses, no skin or nipple changes or axillary nodes.   LABORATORY DATA: Lab Results  Component Value Date   WBC 4.0 02/16/2013   HGB 12.8 02/16/2013   HCT 39.5 02/16/2013   MCV 94.0 02/16/2013   PLT 170 02/16/2013      Chemistry      Component Value Date/Time   NA 143 02/16/2013 0829   K 3.9 02/16/2013 0829   CL 107 07/27/2012 0836   CO2 23 02/16/2013 0829   BUN 9.8 02/16/2013 0829   CREATININE 0.8 02/16/2013 0829      Component Value Date/Time   CALCIUM 8.5 02/16/2013 0829   ALKPHOS 98 02/16/2013 0829   AST 18 02/16/2013 0829   ALT 22 02/16/2013 0829   BILITOT 0.66 02/16/2013 0829  RADIOGRAPHIC STUDIES:  No results found.  ASSESSMENT: 42 year old female with family history of breast cancers. Patient's lifetime risk of developing breast cancer is close to 50%. Because of this she is on surveillance and prevention. We are obtaining mammograms and MRIs as well as however on tamoxifen 20 mg daily. Her MRI has been normal as are her mammograms. She is tolerating tamoxifen very nicely without any problems.   PLAN:   #1 continue tamoxifen 20 mg daily.  #2 return in one years time for followup as well as MRIs.  She is encouraged should continue exercise eating healthy do self breast examinations as well as continue having clinical examinations.   All questions were answered. The patient knows to call the clinic with any problems, questions or concerns. We can certainly see the patient much sooner if necessary.  I spent 25 minutes counseling the patient face to face. The total time spent in the appointment was 30 minutes.    Drue Second, MD [facility] [phone] (beeper) [phone] (Office)

## 2013-05-10 ENCOUNTER — Other Ambulatory Visit: Payer: Self-pay

## 2013-06-15 ENCOUNTER — Telehealth: Payer: Self-pay | Admitting: Oncology

## 2013-06-18 ENCOUNTER — Ambulatory Visit: Payer: PRIVATE HEALTH INSURANCE | Admitting: Oncology

## 2013-06-18 ENCOUNTER — Other Ambulatory Visit: Payer: PRIVATE HEALTH INSURANCE

## 2013-07-17 ENCOUNTER — Encounter: Payer: PRIVATE HEALTH INSURANCE | Admitting: Adult Health

## 2013-07-17 ENCOUNTER — Other Ambulatory Visit: Payer: PRIVATE HEALTH INSURANCE

## 2013-07-18 NOTE — Progress Notes (Signed)
This encounter was created in error - please disregard.

## 2013-07-19 ENCOUNTER — Telehealth: Payer: Self-pay | Admitting: Oncology

## 2013-07-19 NOTE — Telephone Encounter (Signed)
, °

## 2015-06-02 ENCOUNTER — Ambulatory Visit (INDEPENDENT_AMBULATORY_CARE_PROVIDER_SITE_OTHER): Payer: PRIVATE HEALTH INSURANCE | Admitting: Allergy and Immunology

## 2015-06-02 ENCOUNTER — Encounter: Payer: Self-pay | Admitting: Allergy and Immunology

## 2015-06-02 VITALS — BP 100/70 | HR 84 | Temp 98.3°F | Resp 20 | Ht 64.17 in | Wt 162.5 lb

## 2015-06-02 DIAGNOSIS — J3089 Other allergic rhinitis: Secondary | ICD-10-CM

## 2015-06-02 DIAGNOSIS — G4700 Insomnia, unspecified: Secondary | ICD-10-CM

## 2015-06-02 MED ORDER — IPRATROPIUM BROMIDE 0.06 % NA SOLN: NASAL | Status: AC

## 2015-06-02 NOTE — Patient Instructions (Signed)
  1. Allergen avoidance measures  2. OTC Rhinocort (coupon) one spray each nostril one time per day  3. If needed:   A. OTC Antihistamine  B. Ipratropium 0.06% spray - two sprays each nostril every 6 hours if needed.  4. Consider tapering off all caffeine slowly.  5. Immunotherapy?  6. Return in 4 weeks.

## 2015-06-02 NOTE — Progress Notes (Signed)
[facility]    NEW PATIENT NOTE  Referring Provider: Gordan PaymentGrisso, Greg A., MD Primary Provider: Feliciana RossettiGRISSO,GREG, MD    Subjective:   Patient ID: Kathleen Pratt is a 44 y.o. female with a chief complaint of Allergic Rhinitis   who presents to the clinic with the following problems:  HPI Comments:  Kathleen Pratt presents this clinic on 05/31/2015 in evaluation of allergies. She has constant sneezing and runny nose. He is really the runny nose that bothers her the most. She had a rubbing her nose and sometimes getting fissures along her nasal antrum. She may have some minimal eye symptoms associated with this issue. There does not appear to be any seasonality. She things that she might be somewhat worse Monday through Friday while at work but she certainly has problems during the weekend. She has no associated anosmia or ugly nasal discharge or headache. There is no obvious provoking factor giving rise to this issue. There is no associated atopic disease.  Kathleen Pratt also has problems with insomnia. She needs to take a benzodiazepine every night to fall asleep. She can't shut off her brain at night. She does drink coffee every morning but usually does not have any caffeine the rest of the day.   Past Medical History  Diagnosis Date  . Breast cancer screening, high risk patient 05/04/12  . PMDD (premenstrual dysphoric disorder)     Past Surgical History  Procedure Laterality Date  . Cesarean section  2000,2006,2008  . Tonsillectomy    . Adenoidectomy    . Cholecystectomy  2010  . Orthopedic surgery  2012    Femur, humerus     Outpatient Encounter Prescriptions as of 06/02/2015  Medication Sig  . ALPRAZolam (XANAX) 1 MG tablet Take 1 mg by mouth at bedtime as needed for anxiety.  . cetirizine (ZYRTEC) 10 MG tablet Take 10 mg by mouth daily.  . citalopram (CELEXA) 20 MG tablet Take 20 mg by mouth daily.  . diphenhydrAMINE (BENADRYL) 25 mg  capsule Take 25 mg by mouth every 4 (four) hours.  . Ferrous Sulfate (SLOW RELEASE IRON PO) Take by mouth.  . Multiple Vitamins-Minerals (MULTIVITAMIN PO) Take by mouth.  . [DISCONTINUED] ALPRAZolam (XANAX) 0.5 MG tablet Take 0.5 mg by mouth at bedtime as needed. As needed for sleep  . [DISCONTINUED] FLUoxetine (PROZAC) 20 MG capsule Take 20 mg by mouth daily.  . [DISCONTINUED] Multiple Vitamin (MULTIVITAMIN) capsule Take 1 capsule by mouth 3 (three) times daily.  . [DISCONTINUED] omeprazole (PRILOSEC) 20 MG capsule Take 20 mg by mouth daily.  . [DISCONTINUED] tamoxifen (NOLVADEX) 20 MG tablet Take 1 tablet (20 mg total) by mouth daily.   No facility-administered encounter medications on file as of 06/02/2015.    No orders of the defined types were placed in this encounter.    Allergies  Allergen Reactions  . Erythromycin Hives    Review of Systems  Constitutional: Negative.   HENT: Positive for congestion, rhinorrhea and sneezing. Negative for ear discharge, ear pain, facial swelling, hearing loss, mouth sores, nosebleeds, postnasal drip, sinus pressure, sore throat, tinnitus and trouble swallowing.   Eyes: Negative.   Respiratory: Negative.   Cardiovascular: Negative.   Gastrointestinal: Negative.   Endocrine: Negative.   Genitourinary: Negative.   Musculoskeletal: Negative.   Skin: Negative.   Allergic/Immunologic: Negative for environmental allergies, food allergies and immunocompromised state.  Neurological: Negative for dizziness and headaches.  Psychiatric/Behavioral: Positive for sleep disturbance and dysphoric mood.  PMDD and initiation insomnia    Family History  Problem Relation Age of Onset  . Breast cancer Mother 19  . Prostate cancer Maternal Uncle 66  . Breast cancer Paternal Aunt     diagnosed in her 70s; father's paternal half sister  . Lung cancer Maternal Grandmother     non smoker; diagnosed in her 33s  . Breast cancer Paternal Grandmother      diagnosed in her late 32s-early 25s  . Testicular cancer Cousin 30    maternal cousin    Social History   Social History  . Marital Status: Married    Spouse Name: N/A  . Number of Children: N/A  . Years of Education: N/A   Occupational History  . Not on file.   Social History Main Topics  . Smoking status: Former Smoker -- 1.00 packs/day for 15 years    Types: Cigarettes    Quit date: 04/20/2004  . Smokeless tobacco: Not on file  . Alcohol Use: Yes     Comment: socially  . Drug Use: Not on file  . Sexual Activity: Yes   Other Topics Concern  . Not on file   Social History Narrative    Environmental and Social history  Sleeping in a house with a dry Selinda Orion, dog located inside the household, carpeting in the bedroom, sleeping on stuff mattress without any plastic for the bed or pillows, and no smokers located inside the household. She works in an office setting for the hospital.   Objective:   Filed Vitals:   06/02/15 0959  BP: 100/70  Pulse: 84  Temp: 98.3 F (36.8 C)  Resp: 20   Height: 5' 4.17" (163 cm) Weight: 162 lb 7.7 oz (73.7 kg)  Physical Exam  Constitutional: She appears well-developed and well-nourished. No distress.  HENT:  Head: Normocephalic and atraumatic. Head is without right periorbital erythema and without left periorbital erythema.  Right Ear: Tympanic membrane, external ear and ear canal normal. No drainage or tenderness. No foreign bodies. Tympanic membrane is not injected, not scarred, not perforated, not erythematous, not retracted and not bulging. No middle ear effusion.  Left Ear: Tympanic membrane, external ear and ear canal normal. No drainage or tenderness. No foreign bodies. Tympanic membrane is not injected, not scarred, not perforated, not erythematous, not retracted and not bulging.  No middle ear effusion.  Nose: Nose normal. No mucosal edema, rhinorrhea, nose lacerations or sinus tenderness.  No foreign bodies.  Mouth/Throat:  Oropharynx is clear and moist. No oropharyngeal exudate, posterior oropharyngeal edema, posterior oropharyngeal erythema or tonsillar abscesses.  Eyes: Lids are normal. Right eye exhibits no chemosis, no discharge and no exudate. No foreign body present in the right eye. Left eye exhibits no chemosis, no discharge and no exudate. No foreign body present in the left eye. Right conjunctiva is not injected. Left conjunctiva is not injected.  Neck: Neck supple. No tracheal tenderness present. No tracheal deviation and no edema present. No thyroid mass and no thyromegaly present.  Cardiovascular: Normal rate, regular rhythm, S1 normal and S2 normal.  Exam reveals no gallop.   No murmur heard. Pulmonary/Chest: No accessory muscle usage or stridor. No respiratory distress. She has no wheezes. She has no rhonchi. She has no rales.  Abdominal: Soft. She exhibits no distension and no mass. There is no tenderness. There is no rebound and no guarding.  Lymphadenopathy:       Head (right side): No tonsillar adenopathy present.  Head (left side): No tonsillar adenopathy present.    She has no cervical adenopathy.  Neurological: She is alert.  Skin: No rash noted. She is not diaphoretic.  Psychiatric: She has a normal mood and affect. Her behavior is normal.    Diagnostics:  Allergy skin tests were performed. She did not demonstrate any hypersensitivity to a screening panel of aeroallergens or foods  Assessment and Plan:    1. Other allergic rhinitis   2. Insomnia      1. Allergen avoidance measures  2. OTC Rhinocort (coupon) one spray each nostril one time per day  3. If needed:   A. OTC Antihistamine  B. Ipratropium 0.06% spray - two sprays each nostril every 6 hours if needed.  4. Consider tapering off all caffeine slowly.  5. Return in 4 weeks.  I could not find a specific aero allergen that was giving rise to Kathleen Pratt's respiratory tract symptoms. Either she has a nonallergic form of  inflammation or she is allergic to some agent that we could not detect with her skin testing. Sometimes we do see this issue especially with individuals who are allergic to mold. I have given her some nasal ipratropium to help with her most worrisome problem which is rhinorrhea. As well, I made some suggestions about her insomnia regarding her caffeine consumption. We'll see her back in this clinic and possibly 4 weeks or earlier if there is a problem.    Laurette Schimke, MD [facility]

## 2015-07-10 ENCOUNTER — Ambulatory Visit: Payer: PRIVATE HEALTH INSURANCE | Admitting: Allergy and Immunology

## 2015-07-17 ENCOUNTER — Ambulatory Visit (INDEPENDENT_AMBULATORY_CARE_PROVIDER_SITE_OTHER): Payer: PRIVATE HEALTH INSURANCE | Admitting: Allergy and Immunology

## 2015-07-17 ENCOUNTER — Encounter: Payer: Self-pay | Admitting: Allergy and Immunology

## 2015-07-17 VITALS — BP 108/88 | HR 80 | Resp 16

## 2015-07-17 DIAGNOSIS — J3089 Other allergic rhinitis: Secondary | ICD-10-CM | POA: Diagnosis not present

## 2015-07-17 NOTE — Progress Notes (Signed)
[facility] [place]  Follow-up Note  Referring Provider: Gordan Payment., MD Primary Provider: Feliciana Rossetti, MD Date of Office Visit: 07/17/2015  Subjective:   Kathleen Pratt is a 45 y.o. female who [facility] to the [facility] in re-evaluation of the following:  HPI Comments:  Kathleen Pratt returns to this clinic on 07/17/2015 in reevaluation of her rhinitis. While utilizing Rhinocort every morning and intermittent use of nasal ipratropium she's had a very good response to this medical therapy. She has no complaints revolving around her nose at this point in time. Specifically, her constant rhinorrhea appears to have resolved. She's now drinking decaf coffee in the treatment of her insomnia but unfortunately still continues to have insomnia and still requires Xanax to sleep at nighttime. She can't really turn her brain off at nighttime without Center next.   Current Outpatient Prescriptions on File Prior to Visit  Medication Sig Dispense Refill  . ALPRAZolam (XANAX) 1 MG tablet Take 1 mg by mouth at bedtime as needed for anxiety.    . cetirizine (ZYRTEC) 10 MG tablet Take 10 mg by mouth daily.    . diphenhydrAMINE (BENADRYL) 25 mg capsule Take 25 mg by mouth every 4 (four) hours.    . Ferrous Sulfate (SLOW RELEASE IRON PO) Take by mouth.    Marland Kitchen ipratropium (ATROVENT) 0.06 % nasal spray Use two sprays in each nostril every 6 hours if needed for runny nose. 15 mL 5  . Multiple Vitamins-Minerals (MULTIVITAMIN PO) Take by mouth.     No current facility-administered medications on file prior to visit.    No orders of the defined types were placed in this encounter.    Past Medical History  Diagnosis Date  . Breast cancer screening, high risk patient 05/04/12  . PMDD (premenstrual dysphoric disorder)     Past Surgical History  Procedure Laterality Date  . Cesarean section  2000,2006,2008  . Tonsillectomy    . Adenoidectomy    .  Cholecystectomy  2010  . Orthopedic surgery  2012    Femur, humerus     Allergies  Allergen Reactions  . Erythromycin Hives    Review of systems negative except as noted in HPI / PMHx or noted below:  Review of Systems  Constitutional: Negative.   HENT: Negative.   Eyes: Negative.   Respiratory: Negative.   Cardiovascular: Negative.   Gastrointestinal: Negative.   Genitourinary: Negative.   Musculoskeletal: Negative.   Skin: Negative.   Neurological: Negative.   Endo/Heme/Allergies: Negative.   Psychiatric/Behavioral: Negative.      Objective:   Filed Vitals:   07/17/15 1500  BP: 108/88  Pulse: 80  Resp: 16          Physical Exam  Constitutional: She is well-developed, well-nourished, and in no distress. No distress.  HENT:  Head: Normocephalic.  Right Ear: Tympanic membrane, external ear and ear canal normal.  Left Ear: Tympanic membrane, external ear and ear canal normal.  Nose: Nose normal. No mucosal edema or rhinorrhea.  Mouth/Throat: Uvula is midline, oropharynx is clear and moist and mucous membranes are normal. No oropharyngeal exudate.  Eyes: Conjunctivae are normal.  Neck: Trachea normal. No tracheal tenderness present. No tracheal deviation present. No thyromegaly present.  Cardiovascular: Normal rate, regular rhythm, S1 normal, S2 normal and normal heart sounds.   No murmur heard. Pulmonary/Chest: Breath sounds normal. No stridor. No respiratory distress. She has no wheezes. She has no rales.  Musculoskeletal: She exhibits  no edema.  Lymphadenopathy:       Head (right side): No tonsillar adenopathy present.       Head (left side): No tonsillar adenopathy present.    She has no cervical adenopathy.    She has no axillary adenopathy.  Neurological: She is alert. Gait normal.  Skin: No rash noted. She is not diaphoretic. No erythema. Nails show no clubbing.  Psychiatric: Mood and affect normal.    Diagnostics: None   Assessment and Plan:    1. Other allergic rhinitis      1. OTC Rhinocort (coupon) one spray each nostril one time per day  2. If needed:   A. OTC Antihistamine  B. Ipratropium 0.06% spray - two sprays each nostril every 6 hours if needed.  3. Return if needed  Kathleen Pratt has done well and we can now see her back in this clinic as needed. She appears February good understanding of how these medications work and her disease state and we'll certainly be available to see her back in this clinic should she not do well with the plan mentioned above. I also had a talk with her today about her insomnia. Because she can't turn her brain off maybe be best if she talked to her primary care physician about using a serotonergic reuptake blocker in place of or in addition to Xanax. Kathleen SchimkeEric Averly Ericson, MD [facility]

## 2015-07-17 NOTE — Patient Instructions (Signed)
    1. OTC Rhinocort (coupon) one spray each nostril one time per day  2. If needed:   A. OTC Antihistamine  B. Ipratropium 0.06% spray - two sprays each nostril every 6 hours if needed.  3. Return if needed

## 2017-10-14 ENCOUNTER — Encounter (HOSPITAL_COMMUNITY): Payer: Self-pay | Admitting: *Deleted

## 2017-10-14 ENCOUNTER — Other Ambulatory Visit: Payer: Self-pay

## 2017-10-16 MED ORDER — GENTAMICIN SULFATE 40 MG/ML IJ SOLN
5.0000 mg/kg | INTRAMUSCULAR | Status: AC
  Administered 2017-10-17: 370 mg via INTRAVENOUS
  Filled 2017-10-16: qty 9.25

## 2017-10-17 ENCOUNTER — Encounter (HOSPITAL_COMMUNITY): Payer: Self-pay | Admitting: *Deleted

## 2017-10-17 ENCOUNTER — Other Ambulatory Visit: Payer: Self-pay

## 2017-10-17 ENCOUNTER — Ambulatory Visit (HOSPITAL_COMMUNITY): Payer: Commercial Managed Care - PPO

## 2017-10-17 ENCOUNTER — Ambulatory Visit (HOSPITAL_COMMUNITY)
Admission: RE | Admit: 2017-10-17 | Discharge: 2017-10-17 | Disposition: A | Discharge status: Home / Self Care | Payer: Commercial Managed Care - PPO | Source: Ambulatory Visit | Attending: Urology | Admitting: Urology

## 2017-10-17 ENCOUNTER — Encounter (HOSPITAL_COMMUNITY)
Admission: RE | Disposition: A | Discharge status: Home / Self Care | Payer: Self-pay | Source: Ambulatory Visit | Attending: Urology

## 2017-10-17 DIAGNOSIS — N201 Calculus of ureter: Secondary | ICD-10-CM | POA: Diagnosis present

## 2017-10-17 DIAGNOSIS — Z9884 Bariatric surgery status: Secondary | ICD-10-CM | POA: Insufficient documentation

## 2017-10-17 DIAGNOSIS — N200 Calculus of kidney: Secondary | ICD-10-CM

## 2017-10-17 HISTORY — PX: EXTRACORPOREAL SHOCK WAVE LITHOTRIPSY: SHX1557

## 2017-10-17 HISTORY — DX: Personal history of urinary calculi: Z87.442

## 2017-10-17 LAB — PREGNANCY, URINE: Preg Test, Ur: NEGATIVE

## 2017-10-17 SURGERY — LITHOTRIPSY, ESWL
Anesthesia: Moderate Sedation | Laterality: Left

## 2017-10-17 MED ORDER — ONDANSETRON HCL 4 MG/2ML IJ SOLN: 4.0000 mg | Freq: Once | INTRAMUSCULAR | Status: DC

## 2017-10-17 MED ORDER — HYDROCODONE-ACETAMINOPHEN 5-325 MG PO TABS: 1.0000 | ORAL_TABLET | ORAL | Status: DC | PRN

## 2017-10-17 MED ORDER — DEXTROSE IN LACTATED RINGERS 5 % IV SOLN
INTRAVENOUS | Status: DC
  Administered 2017-10-17: 16:00:00 via INTRAVENOUS

## 2017-10-17 MED ORDER — DIPHENHYDRAMINE HCL 25 MG PO CAPS
25.0000 mg | ORAL_CAPSULE | Freq: Once | ORAL | Status: AC
Start: 2017-10-17 — End: 2017-10-17
  Administered 2017-10-17: 25 mg via ORAL
  Filled 2017-10-17: qty 1

## 2017-10-17 MED ORDER — DIAZEPAM 5 MG PO TABS
10.0000 mg | ORAL_TABLET | Freq: Once | ORAL | Status: AC
  Administered 2017-10-17: 10 mg via ORAL
  Filled 2017-10-17: qty 2

## 2017-10-17 NOTE — Discharge Instructions (Signed)
Moderate Conscious Sedation, Adult, Care After °These instructions provide you with information about caring for yourself after your procedure. Your health care provider may also give you more specific instructions. Your treatment has been planned according to current medical practices, but problems sometimes occur. Call your health care provider if you have any problems or questions after your procedure. °What can I expect after the procedure? °After your procedure, it is common: °To feel sleepy for several hours. °To feel clumsy and have poor balance for several hours. °To have poor judgment for several hours. °To vomit if you eat too soon. ° °Follow these instructions at home: °For at least 24 hours after the procedure: ° °Do not: °Participate in activities where you could fall or become injured. °Drive. °Use heavy machinery. °Drink alcohol. °Take sleeping pills or medicines that cause drowsiness. °Make important decisions or sign legal documents. °Take care of children on your own. °Rest. °Eating and drinking °Follow the diet recommended by your health care provider. °If you vomit: °Drink water, juice, or soup when you can drink without vomiting. °Make sure you have little or no nausea before eating solid foods. °General instructions °Have a responsible adult stay with you until you are awake and alert. °Take over-the-counter and prescription medicines only as told by your health care provider. °If you smoke, do not smoke without supervision. °Keep all follow-up visits as told by your health care provider. This is important. °Contact a health care provider if: °You keep feeling nauseous or you keep vomiting. °You feel light-headed. °You develop a rash. °You have a fever. °Get help right away if: °You have trouble breathing. °This information is not intended to replace advice given to you by your health care provider. Make sure you discuss any questions you have with your health care provider. °Document Released:  04/11/2013 Document Revised: 11/24/2015 Document Reviewed: 10/11/2015 °Elsevier Interactive Patient Education © 2018 [employer] °Lithotripsy, Care After °This sheet gives you information about how to care for yourself after your procedure. Your health care provider may also give you more specific instructions. If you have problems or questions, contact your health care provider. °What can I expect after the procedure? °After the procedure, it is common to have: °· Some blood in your urine. This should only last for a few days. °· Soreness in your back, sides, or upper abdomen for a few days. °· Blotches or bruises on your back where the pressure wave entered the skin. °· Pain, discomfort, or nausea when pieces (fragments) of the kidney stone move through the tube that carries urine from the kidney to the bladder (ureter). Stone fragments may pass soon after the procedure, but they may continue to pass for up to 4-8 weeks. °? If you have severe pain or nausea, contact your health care provider. This may be caused by a large stone that was not broken up, and this may mean that you need more treatment. °· Some pain or discomfort during urination. °· Some pain or discomfort in the lower abdomen or (in men) at the base of the penis. ° °Follow these instructions at home: °Medicines °· Take over-the-counter and prescription medicines only as told by your health care provider. °· If you were prescribed an antibiotic medicine, take it as told by your health care provider. Do not stop taking the antibiotic even if you start to feel better. °· Do not drive for 24 hours if you were given a medicine to help you relax (sedative). °· Do not drive   or use heavy machinery while taking prescription pain medicine. °Eating and drinking °· Drink enough water and fluids to keep your urine clear or pale yellow. This helps any remaining pieces of the stone to pass. It can also help prevent new stones from forming. °· Eat plenty of fresh  fruits and vegetables. °· Follow instructions from your health care provider about eating and drinking restrictions. You may be instructed: °? To reduce how much salt (sodium) you eat or drink. Check ingredients and nutrition facts on packaged foods and beverages. °? To reduce how much meat you eat. °· Eat the recommended amount of calcium for your age and gender. Ask your health care provider how much calcium you should have. °General instructions °· Get plenty of rest. °· Most people can resume normal activities 1-2 days after the procedure. Ask your health care provider what activities are safe for you. °· If directed, strain all urine through the strainer that was provided by your health care provider. °? Keep all fragments for your health care provider to see. Any stones that are found may be sent to a medical lab for examination. The stone may be as small as a grain of salt. °· Keep all follow-up visits as told by your health care provider. This is important. °Contact a health care provider if: °· You have pain that is severe or does not get better with medicine. °· You have nausea that is severe or does not go away. °· You have blood in your urine longer than your health care provider told you to expect. °· You have more blood in your urine. °· You have pain during urination that does not go away. °· You urinate more frequently than usual and this does not go away. °· You develop a rash or any other possible signs of an allergic reaction. °Get help right away if: °· You have severe pain in your back, sides, or upper abdomen. °· You have severe pain while urinating. °· Your urine is very dark red. °· You have blood in your stool (feces). °· You cannot pass any urine at all. °· You feel a strong urge to urinate after emptying your bladder. °· You have a fever or chills. °· You develop shortness of breath, difficulty breathing, or chest pain. °· You have severe nausea that leads to persistent vomiting. °· You  faint. °Summary °· After this procedure, it is common to have some pain, discomfort, or nausea when pieces (fragments) of the kidney stone move through the tube that carries urine from the kidney to the bladder (ureter). If this pain or nausea is severe, however, you should contact your health care provider. °· Most people can resume normal activities 1-2 days after the procedure. Ask your health care provider what activities are safe for you. °· Drink enough water and fluids to keep your urine clear or pale yellow. This helps any remaining pieces of the stone to pass, and it can help prevent new stones from forming. °· If directed, strain your urine and keep all fragments for your health care provider to see. Fragments or stones may be as small as a grain of salt. °· Get help right away if you have severe pain in your back, sides, or upper abdomen or have severe pain while urinating. °This information is not intended to replace advice given to you by your health care provider. Make sure you discuss any questions you have with your health care provider. °Document Released: 07/11/2007 Document Revised:   05/12/2016 Document Reviewed: 05/12/2016 °Elsevier Interactive Patient Education © 2018 [employer] ° °

## 2017-10-18 ENCOUNTER — Encounter (HOSPITAL_COMMUNITY): Payer: Self-pay | Admitting: Urology

## 2019-06-08 IMAGING — CR DG ABDOMEN 1V
1 series · 1 of 1 positions shown · non-contrast
Comparison: None.

CLINICAL DATA: Pre litho.  Kidney stones.

EXAM:
ABDOMEN - 1 VIEW

[t abdomen supine]
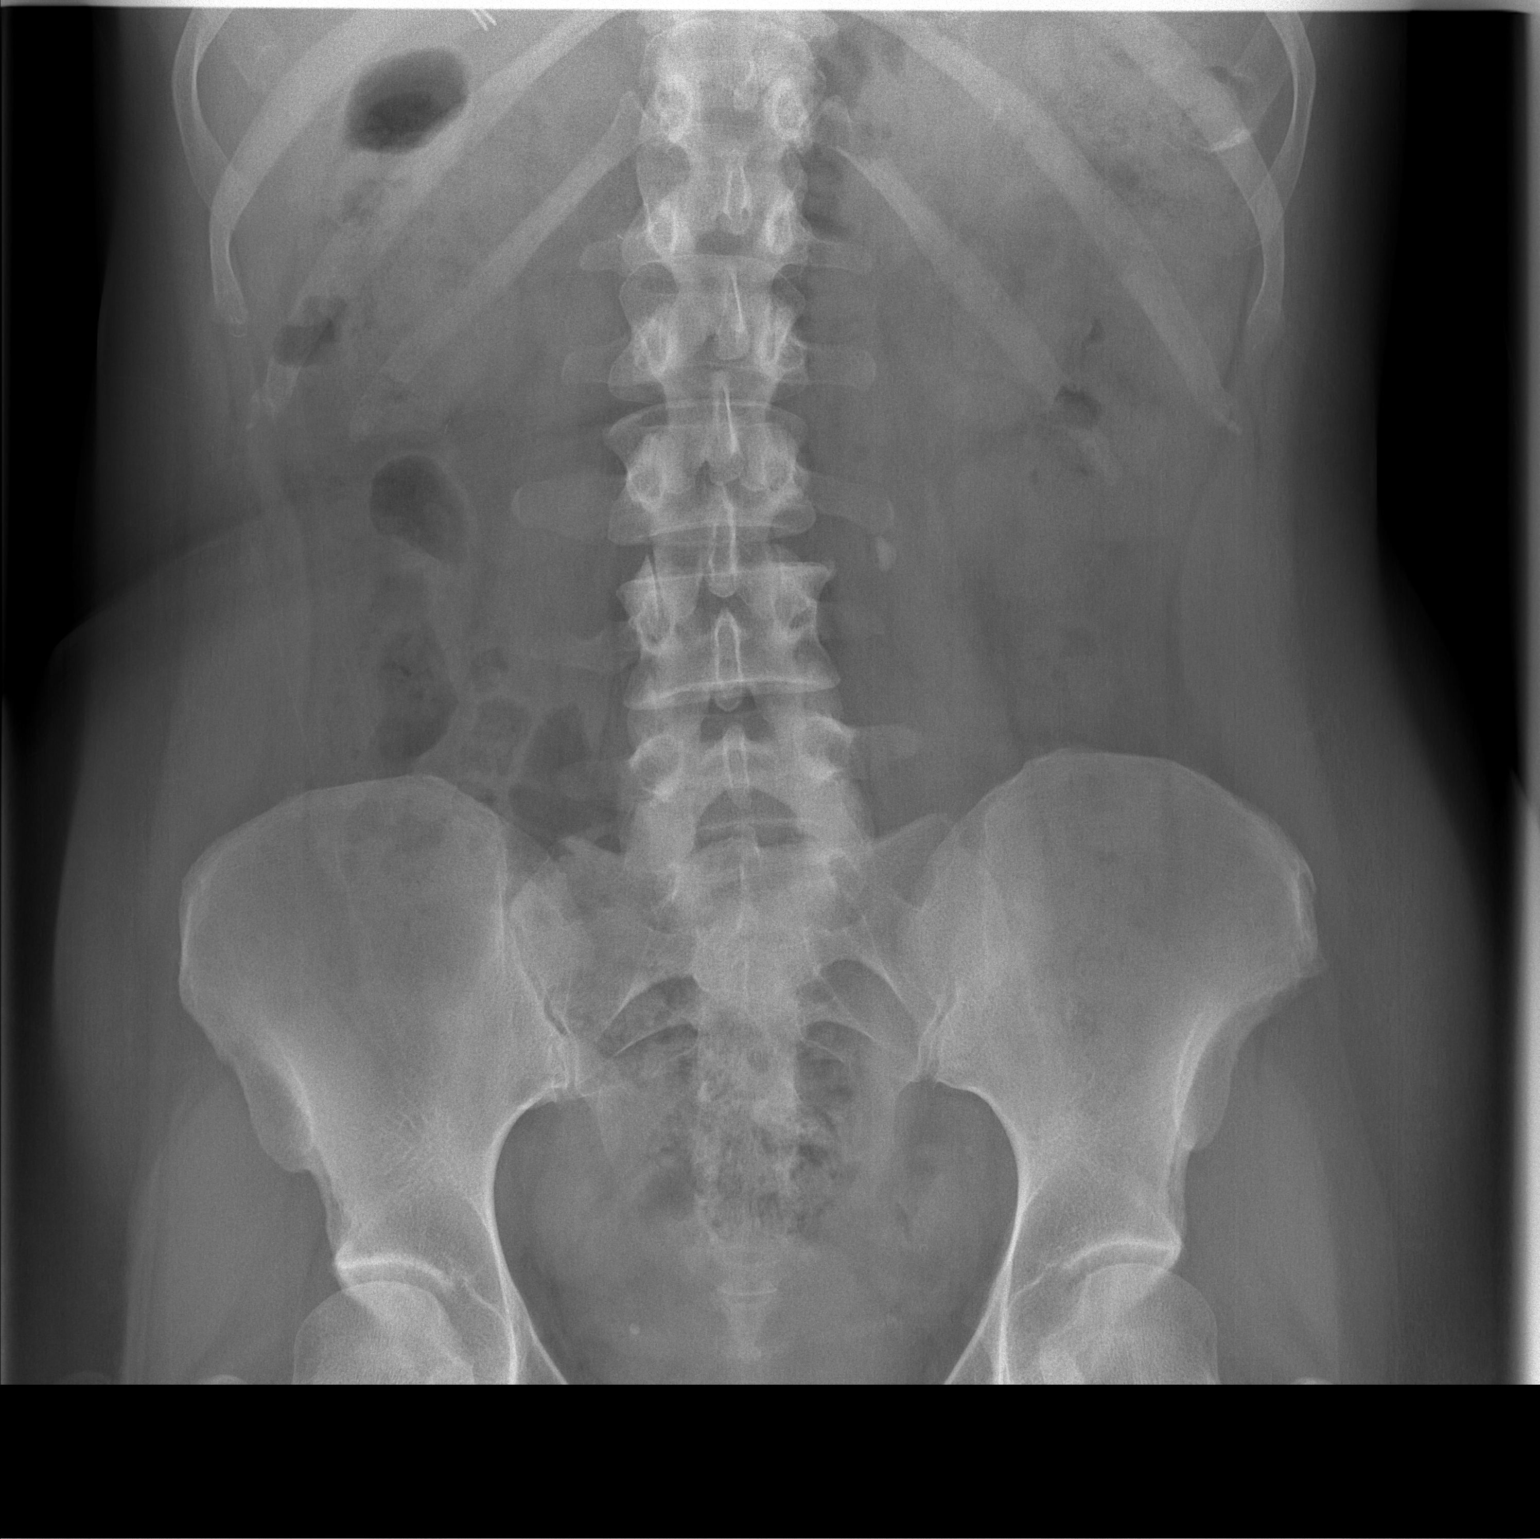

[1 of 1 positions shown; findings below may reference images not displayed]

FINDINGS: The bowel gas pattern is normal. A 7 mm calculus is seen on the
LEFT, proximal ureter, at the upper L4 level. No definite intrarenal
calculi.
IMPRESSION: 7 mm proximal LEFT ureteral stone.

## 2024-03-09 ENCOUNTER — Encounter (HOSPITAL_BASED_OUTPATIENT_CLINIC_OR_DEPARTMENT_OTHER): Payer: Self-pay | Admitting: Emergency Medicine

## 2024-03-09 ENCOUNTER — Ambulatory Visit (HOSPITAL_BASED_OUTPATIENT_CLINIC_OR_DEPARTMENT_OTHER)
Admission: EM | Admit: 2024-03-09 | Discharge: 2024-03-09 | Disposition: A | Discharge status: Home / Self Care | Payer: PRIVATE HEALTH INSURANCE

## 2024-03-09 ENCOUNTER — Ambulatory Visit (INDEPENDENT_AMBULATORY_CARE_PROVIDER_SITE_OTHER)
Admit: 2024-03-09 | Discharge: 2024-03-09 | Disposition: A | Discharge status: Home / Self Care | Payer: PRIVATE HEALTH INSURANCE | Admitting: Radiology

## 2024-03-09 DIAGNOSIS — R0781 Pleurodynia: Secondary | ICD-10-CM | POA: Diagnosis not present

## 2024-03-09 DIAGNOSIS — S20212A Contusion of left front wall of thorax, initial encounter: Secondary | ICD-10-CM | POA: Diagnosis not present

## 2024-03-09 DIAGNOSIS — W010XXA Fall on same level from slipping, tripping and stumbling without subsequent striking against object, initial encounter: Secondary | ICD-10-CM

## 2024-03-09 DIAGNOSIS — S20312A Abrasion of left front wall of thorax, initial encounter: Secondary | ICD-10-CM

## 2024-03-09 MED ORDER — OXYCODONE-ACETAMINOPHEN 5-325 MG PO TABS: 1.0000 | ORAL_TABLET | Freq: Four times a day (QID) | ORAL | 0 refills | Status: AC | PRN

## 2024-03-09 MED ORDER — MUPIROCIN 2 % EX OINT: 1.0000 | TOPICAL_OINTMENT | Freq: Two times a day (BID) | CUTANEOUS | 0 refills | Status: AC

## 2024-03-09 NOTE — ED Provider Notes (Signed)
 [facility]    CSN: [id number] Arrival date & time: 03/09/24  1809      History   Chief Complaint Chief Complaint  Patient presents with   Rib Injury    HPI Kathleen Pratt is a 53 y.o. female.   Patient was walking this afternoon and slipped and fell and hit the left side of her ribs.  She has an abrasion and bruising and significant left rib pain.  The pain is over the 8th, 9th and 10th ribs below her bra.     Past Medical History:  Diagnosis Date   Breast cancer screening, high risk patient 05/04/12   History of kidney stones    PMDD (premenstrual dysphoric disorder)     Patient Active Problem List   Diagnosis Date Noted   Other allergic rhinitis 07/17/2015   Breast cancer screening, high risk patient     Past Surgical History:  Procedure Laterality Date   ADENOIDECTOMY     CESAREAN SECTION  2000,2006,2008   CHOLECYSTECTOMY  2010   EXTRACORPOREAL SHOCK WAVE LITHOTRIPSY Left 10/17/2017   Procedure: LEFT EXTRACORPOREAL SHOCK WAVE LITHOTRIPSY (ESWL);  Surgeon: Marda General, MD;  Location: [facility];  Service: Urology;  Laterality: Left;   GASTRIC BYPASS     ORTHOPEDIC SURGERY  2012   Femur, humerus    TONSILLECTOMY      OB History   No obstetric history on file.      Home Medications    Prior to Admission medications   Medication Sig Start Date End Date Taking? Authorizing Provider  ALPRAZolam (XANAX) 1 MG tablet Take 1 mg by mouth every 6 (six) hours as needed for anxiety.    Yes [provider]  buPROPion (WELLBUTRIN SR) 100 MG 12 hr tablet Take 100 mg by mouth daily.   Yes [provider]  cetirizine (ZYRTEC) 10 MG tablet Take 10 mg by mouth daily.   Yes [provider]  Ferrous Sulfate (SLOW RELEASE IRON PO) Take by mouth.   Yes [provider]  Multiple Vitamins-Minerals (MULTIVITAMIN PO) Take by mouth.   Yes [provider]  mupirocin  ointment (BACTROBAN ) 2 % Apply 1 Application topically 2 (two)  times daily. 03/09/24  Yes Ival Domino, FNP  oxyCODONE -acetaminophen  (PERCOCET/ROXICET) 5-325 MG tablet Take 1 tablet by mouth every 6 (six) hours as needed for severe pain (pain score 7-10). 03/09/24  Yes Ival Domino, FNP  traZODone (DESYREL) 100 MG tablet Take 100 mg by mouth. 06/14/22 07/25/24 Yes [provider]  albuterol (PROVENTIL) (2.5 MG/3ML) 0.083% nebulizer solution Take 2.5 mg by nebulization. Use one vial in nebulizer every four to six hours as needed for cough or wheeze.    [provider]  budesonide (RHINOCORT ALLERGY) 32 MCG/ACT nasal spray Place 1 spray into both nostrils daily.    [provider]  CALCIUM PO Take 1 tablet by mouth daily.    [provider]  citalopram (CELEXA) 10 MG tablet Take 10 mg by mouth daily. 06/14/15   [provider]  diphenhydrAMINE  (BENADRYL ) 25 mg capsule Take 25 mg by mouth every 4 (four) hours.    [provider]  FLUoxetine (PROZAC) 20 MG tablet Take 20 mg by mouth daily.    [provider]  ipratropium (ATROVENT ) 0.06 % nasal spray Use two sprays in each nostril every 6 hours if needed for runny nose. 06/02/15   Kozlow, Camellia PARAS, MD  ondansetron  (ZOFRAN ) 4 MG tablet Take 4 mg by mouth every 8 (eight)  hours as needed for nausea or vomiting.    [provider]  oxyCODONE  (OXY IR/ROXICODONE ) 5 MG immediate release tablet Take 5 mg by mouth every 4 (four) hours as needed for severe pain.    [provider]  tamsulosin (FLOMAX) 0.4 MG CAPS capsule Take 0.4 mg by mouth daily.    [provider]    Family History Family History  Problem Relation Age of Onset   Breast cancer Mother 8   Prostate cancer Maternal Uncle 73   Breast cancer Paternal Aunt        diagnosed in her 52s; father's paternal half sister   Lung cancer Maternal Grandmother        non smoker; diagnosed in her 61s   Breast cancer Paternal Grandmother        diagnosed in her late 27s-early 32s    Testicular cancer Cousin 52       maternal cousin    Social History Social History   Tobacco Use   Smoking status: Former    Current packs/day: 0.00    Average packs/day: 1 pack/day for 15.0 years (15.0 ttl pk-yrs)    Types: Cigarettes    Start date: 04/20/1989    Quit date: 04/20/2004    Years since quitting: 19.8   Smokeless tobacco: Never  Vaping Use   Vaping status: Never Used  Substance Use Topics   Alcohol use: Yes    Comment: socially   Drug use: Never     Allergies   Patient has no known allergies.   Review of Systems Review of Systems  Constitutional:  Negative for chills and fever.  HENT:  Negative for ear pain and sore throat.   Eyes:  Negative for pain and visual disturbance.  Respiratory:  Negative for cough and shortness of breath.   Cardiovascular:  Positive for chest pain (Rib pain on the left). Negative for palpitations.  Gastrointestinal:  Negative for abdominal pain, constipation, diarrhea, nausea and vomiting.  Genitourinary:  Negative for dysuria and hematuria.  Musculoskeletal:  Negative for arthralgias and back pain.  Skin:  Positive for wound (Abrasion on the left lower ribs). Negative for color change and rash.  Neurological:  Negative for seizures and syncope.  All other systems reviewed and are negative.    Physical Exam Triage Vital Signs ED Triage Vitals  Encounter Vitals Group     BP 03/09/24 1827 114/76     Girls Systolic BP Percentile --      Girls Diastolic BP Percentile --      Boys Systolic BP Percentile --      Boys Diastolic BP Percentile --      Pulse Rate 03/09/24 1827 79     Resp 03/09/24 1827 18     Temp 03/09/24 1827 97.9 F (36.6 C)     Temp Source 03/09/24 1827 Oral     SpO2 03/09/24 1827 97 %     Weight --      Height --      Head Circumference --      Peak Flow --      Pain Score 03/09/24 1824 9     Pain Loc --      Pain Education --      Exclude from Growth Chart --    No data found.  Updated Vital  Signs BP 114/76 (BP Location: Right Arm)   Pulse 79   Temp 97.9 F (36.6 C) (Oral)   Resp 18   LMP 10/10/2017 (  Exact Date) Comment: tubal ligation - neg preg test today 04.15.2019  SpO2 97%   Visual Acuity Right Eye Distance:   Left Eye Distance:   Bilateral Distance:    Right Eye Near:   Left Eye Near:    Bilateral Near:     Physical Exam Vitals and nursing note reviewed.  Constitutional:      General: She is not in acute distress.    Appearance: She is well-developed. She is not ill-appearing or toxic-appearing.  HENT:     Head: Normocephalic and atraumatic.     Right Ear: Hearing, tympanic membrane, ear canal and external ear normal.     Left Ear: Hearing, tympanic membrane, ear canal and external ear normal.     Nose: No congestion or rhinorrhea.     Right Sinus: No maxillary sinus tenderness or frontal sinus tenderness.     Left Sinus: No maxillary sinus tenderness or frontal sinus tenderness.     Mouth/Throat:     Lips: Pink.     Mouth: Mucous membranes are moist.     Pharynx: Uvula midline. No oropharyngeal exudate or posterior oropharyngeal erythema.     Tonsils: No tonsillar exudate.  Eyes:     Conjunctiva/sclera: Conjunctivae normal.     Pupils: Pupils are equal, round, and reactive to light.  Cardiovascular:     Rate and Rhythm: Normal rate and regular rhythm.     Heart sounds: S1 normal and S2 normal. No murmur heard. Pulmonary:     Effort: Pulmonary effort is normal. No respiratory distress.     Breath sounds: Normal breath sounds. No decreased breath sounds, wheezing, rhonchi or rales.  Abdominal:     General: Bowel sounds are normal.     Palpations: Abdomen is soft.     Tenderness: There is no abdominal tenderness.  Musculoskeletal:        General: No swelling.     Cervical back: Normal and neck supple.     Thoracic back: Tenderness (Left lateral and anterior ribs from the seventh to the 10th rib.  Moderate to severe tenderness.  There is an area of  ecchymosis and an abrasion at the point of impact on her fall.  See photo for the abrasion.) present.     Lumbar back: Normal.  Lymphadenopathy:     Head:     Right side of head: No submental, submandibular, tonsillar, preauricular or posterior auricular adenopathy.     Left side of head: No submental, submandibular, tonsillar, preauricular or posterior auricular adenopathy.     Cervical: No cervical adenopathy.     Right cervical: No superficial cervical adenopathy.    Left cervical: No superficial cervical adenopathy.  Skin:    General: Skin is warm and dry.     Capillary Refill: Capillary refill takes less than 2 seconds.     Findings: Abrasion (4 cm wide by 5 mm deep abrasion on the left anterior ribs with superficial injury.  There is moderate ecchymosis in the area and the area is very tender to touch.  See photo for more information) present. No rash or wound.  Neurological:     Mental Status: She is alert and oriented to person, place, and time.  Psychiatric:        Mood and Affect: Mood normal.      UC Treatments / Results  Labs (all labs ordered are listed, but only abnormal results are displayed) Labs Reviewed - No data to display  EKG   Radiology DG Ribs  Unilateral W/Chest Left Result Date: 03/09/2024 CLINICAL DATA:  Left-sided rib pain EXAM: LEFT RIBS AND CHEST - 3+ VIEW COMPARISON:  01/16/2018 FINDINGS: Frontal view of the chest as well as frontal and oblique views of the left thoracic cage are obtained. Cardiac silhouette is unremarkable. No acute airspace disease, effusion, or pneumothorax. No acute displaced fracture. IMPRESSION: 1. No acute displaced fracture.  No acute intrathoracic process. Electronically Signed   By: Ozell Daring M.D.   On: 03/09/2024 19:42    Procedures Procedures (including critical care time)  Medications Ordered in UC Medications - No data to display  Initial Impression / Assessment and Plan / UC Course  I have reviewed the triage  vital signs and the nursing notes.  Pertinent labs & imaging results that were available during my care of the patient were reviewed by me and considered in my medical decision making (see chart for details).  Plan of Care: Contusion of ribs on the left flank with an abrasion: X-rays are negative for fracture.  Abrasion and bruising are consistent with a moderate contusion.  Rib belt applied.  Discussed the risk of pneumonia from poor inhalation when using her rib belt.  Encouraged to use an incentive spirometer, for 1 to 2000 mL of lung volume each inhalation, 10 inhalations/h and do this every hour awake.  She has an incentive spirometer at home and will do this.  She will be cautious with use of her rib belt and is aware of the risk of pneumonia if she uses a rib belt  Left lower rib abrasion: Clean with warm soapy water, rinse, pat dry, apply mupirocin  ointment.  See discharge instructions for signs of infection and reasons to return.  Left lower rib pain from rib contusion: Provided Percocet 5/325 mg, 1 pill every 6 hours as needed for pain.  Encouraged to use acetaminophen  or ibuprofen as directed on the package, during the day as needed for pain.  Encouraged to save the Percocet for use at night.  May need to see primary care for additional pain medicine if she needs pain medicine once the supply runs out.  Follow-up if symptoms do not improve, worsen or new symptoms occur.  I reviewed the plan of care with the patient and/or the patient's guardian.  The patient and/or guardian had time to ask questions and acknowledged that the questions were answered.  I provided instruction on symptoms or reasons to return here or to go to an ER, if symptoms/condition did not improve, worsened or if new symptoms occurred.  Final Clinical Impressions(s) / UC Diagnoses   Final diagnoses:  Abrasion of left chest wall, initial encounter  Fall on same level from slipping, tripping or stumbling, initial  encounter  Rib pain on left side  Contusion of rib on left side, initial encounter     Discharge Instructions      Contusion of ribs on left side after fall: X-rays appear negative.  Patient has absolute bruising consistent with a contusion.  She has significant tenderness in the left lower ribs.  Rib belt applied.  Patient has an incentive spirometer at home.  Advised that she needs 10 inhalations between 1 to 2000 mL of lung volume once an hour, every hour she is awake.  Use of the incentive spirometer will help reduce the risk of pneumonia with the contusion and use of the rib belt.  Pain and left ribs could be managed with acetaminophen  or ibuprofen during the day.  She does have a  rib belt to help with the pain.  Percocet, 5/325 mg, 1 pill every 6 hours as needed for rib pain.  Try to reserve Percocet for night use only.  Once this prescription is gone, will need to see primary care or orthopedics for any additional narcotic pain medicine.  May use acetaminophen  or ibuprofen during the day.  Abrasion to left lower ribs: Clean with warm soapy fingers, rinse, pat dry, apply mupirocin  ointment and a bandage.  If there is any redness, discharge, odor, worsening pain or fever, these are signs of infection and reasons to return here or go to an emergency room.  Follow-up if symptoms do not improve, worsen or new symptoms occur.     ED Prescriptions     Medication Sig Dispense Auth. Provider   mupirocin  ointment (BACTROBAN ) 2 % Apply 1 Application topically 2 (two) times daily. 22 g Ival Domino, FNP   oxyCODONE -acetaminophen  (PERCOCET/ROXICET) 5-325 MG tablet Take 1 tablet by mouth every 6 (six) hours as needed for severe pain (pain score 7-10). 10 tablet Dyllan Hughett, FNP      I have reviewed the PDMP during this encounter.   Ival Domino, FNP 03/09/24 2115

## 2024-03-09 NOTE — ED Triage Notes (Signed)
 Pt reports she was walking and fell on her left side of ribs, pt has a open area to left rib area below her bra.

## 2024-03-09 NOTE — Discharge Instructions (Addendum)
 Contusion of ribs on left side after fall: X-rays appear negative.  Patient has absolute bruising consistent with a contusion.  She has significant tenderness in the left lower ribs.  Rib belt applied.  Patient has an incentive spirometer at home.  Advised that she needs 10 inhalations between 1 to 2000 mL of lung volume once an hour, every hour she is awake.  Use of the incentive spirometer will help reduce the risk of pneumonia with the contusion and use of the rib belt.  Pain and left ribs could be managed with acetaminophen  or ibuprofen during the day.  She does have a rib belt to help with the pain.  Percocet, 5/325 mg, 1 pill every 6 hours as needed for rib pain.  Try to reserve Percocet for night use only.  Once this prescription is gone, will need to see primary care or orthopedics for any additional narcotic pain medicine.  May use acetaminophen  or ibuprofen during the day.  Abrasion to left lower ribs: Clean with warm soapy fingers, rinse, pat dry, apply mupirocin  ointment and a bandage.  If there is any redness, discharge, odor, worsening pain or fever, these are signs of infection and reasons to return here or go to an emergency room.  Follow-up if symptoms do not improve, worsen or new symptoms occur.
